# Patient Record
Sex: Male | Born: 1960 | Race: White | Hispanic: No | Marital: Married | Smoking: Former smoker
Health system: Southern US, Community
[De-identification: ages and names within clinical notes are randomized; demographics above are authoritative.]

## PROBLEM LIST (undated history)

## (undated) ENCOUNTER — Emergency Department: Admission: EM | Discharge status: Absent without leave | Payer: Self-pay

## (undated) DIAGNOSIS — R9082 White matter disease, unspecified: Secondary | ICD-10-CM

## (undated) DIAGNOSIS — N200 Calculus of kidney: Secondary | ICD-10-CM

## (undated) DIAGNOSIS — R51 Headache: Secondary | ICD-10-CM

## (undated) DIAGNOSIS — N289 Disorder of kidney and ureter, unspecified: Secondary | ICD-10-CM

## (undated) DIAGNOSIS — Z87891 Personal history of nicotine dependence: Secondary | ICD-10-CM

## (undated) DIAGNOSIS — N400 Enlarged prostate without lower urinary tract symptoms: Secondary | ICD-10-CM

## (undated) DIAGNOSIS — G44099 Other trigeminal autonomic cephalgias (TAC), not intractable: Secondary | ICD-10-CM

## (undated) DIAGNOSIS — R519 Headache, unspecified: Secondary | ICD-10-CM

## (undated) HISTORY — PX: BICEPT TENODESIS: SHX5116

## (undated) HISTORY — DX: Calculus of kidney: N20.0

## (undated) HISTORY — DX: Headache: R51

## (undated) HISTORY — PX: ROTATOR CUFF REPAIR: SHX139

## (undated) HISTORY — DX: Headache, unspecified: R51.9

## (undated) HISTORY — DX: Benign prostatic hyperplasia without lower urinary tract symptoms: N40.0

## (undated) HISTORY — DX: White matter disease, unspecified: R90.82

## (undated) HISTORY — DX: Personal history of nicotine dependence: Z87.891

## (undated) HISTORY — PX: COLONOSCOPY: SHX174

## (undated) HISTORY — PX: LUMBAR EPIDURAL INJECTION: SHX1980

## (undated) HISTORY — DX: Other trigeminal autonomic cephalgias (tac), not intractable: G44.099

## (undated) HISTORY — PX: HERNIA REPAIR: SHX51

## (undated) HISTORY — PX: HAND SURGERY: SHX662

## (undated) HISTORY — PX: LITHOTRIPSY: SUR834

---

## 2011-12-28 ENCOUNTER — Emergency Department (INDEPENDENT_AMBULATORY_CARE_PROVIDER_SITE_OTHER)
Admission: EM | Admit: 2011-12-28 | Discharge: 2011-12-28 | Disposition: A | Discharge status: Home / Self Care | Payer: Managed Care, Other (non HMO) | Source: Home / Self Care | Attending: Family Medicine | Admitting: Family Medicine

## 2011-12-28 DIAGNOSIS — R319 Hematuria, unspecified: Secondary | ICD-10-CM

## 2011-12-28 LAB — POCT CBC W AUTO DIFF (K'VILLE URGENT CARE)

## 2011-12-28 LAB — COMPREHENSIVE METABOLIC PANEL
Alkaline Phosphatase: 58 U/L (ref 39–117)
Glucose, Bld: 93 mg/dL (ref 70–99)
Sodium: 144 mEq/L (ref 135–145)
Total Bilirubin: 0.5 mg/dL (ref 0.3–1.2)
Total Protein: 6.8 g/dL (ref 6.0–8.3)

## 2011-12-28 LAB — POCT URINALYSIS DIP (MANUAL ENTRY): Leukocytes, UA: NEGATIVE

## 2011-12-28 MED ORDER — CIPROFLOXACIN HCL 500 MG PO TABS: 500.0000 mg | ORAL_TABLET | Freq: Two times a day (BID) | ORAL | Status: DC

## 2011-12-28 NOTE — ED Provider Notes (Signed)
History     CSN: [id number]  Arrival date & time 12/28/11  1455   First MD Initiated Contact with Patient 12/28/11 1533      Chief Complaint  Patient presents with  . Dysuria      HPI Comments: Two days ago patient developed rather sudden left abdominal and flank pain, associated with nausea.  He also developed a migraine headache, fatigue, and dizziness.  Yesterday he noticed very dark urine, and today he had sweats but states that his pain has resolved. He has a past history of kidney stones, having passed them spontaneously.  Patient is a 51 y.o. male presenting with flank pain. The history is provided by the patient and the spouse.  Flank Pain This is a new problem. The current episode started 2 days ago. The problem occurs constantly. The problem has been resolved. Associated symptoms include abdominal pain and headaches. Pertinent negatives include no shortness of breath. Nothing aggravates the symptoms. Nothing relieves the symptoms. He has tried nothing for the symptoms.    History reviewed. No pertinent past medical history.  Past Surgical History  Procedure Date  . Hand surgery   . Rotator cuff repair   . Bicept tenodesis     Family History  Problem Relation Age of Onset  . Hypertension Mother   . Hyperlipidemia Mother   . Hypertension Father   . Hyperlipidemia Father   . Stroke Father   . Diabetes Father   . Hypertension Sister   . Hyperlipidemia Sister   . Hypertension Brother   . Hyperlipidemia Brother     History  Substance Use Topics  . Smoking status: Former Games developer  . Smokeless tobacco: Not on file  . Alcohol Use: No      Review of Systems  Respiratory: Negative for shortness of breath.   Gastrointestinal: Positive for abdominal pain.  Genitourinary: Positive for flank pain.  Neurological: Positive for headaches.  All other systems reviewed and are negative.    Allergies  Review of patient's allergies indicates no known  allergies.  Home Medications   Current Outpatient Rx  Name  Route  Sig  Dispense  Refill  . CIPROFLOXACIN HCL 500 MG PO TABS   Oral   Take 1 tablet (500 mg total) by mouth 2 (two) times daily.   14 tablet   0     BP 104/68  Pulse 71  Temp 97.8 F (36.6 C) (Oral)  Resp 18  Ht 6' (1.829 m)  Wt 168 lb 9 oz (76.459 kg)  BMI 22.86 kg/m2  SpO2 97%  Physical Exam Nursing notes and Vital Signs reviewed. Appearance:  Patient appears healthy, stated age, and in no acute distress Eyes:  Pupils are equal, round, and reactive to light and accomodation.  Extraocular movement is intact.  Conjunctivae are not inflamed  Ears:  Canals normal.  Tympanic membranes normal.  Nose:   Normal turbinates.  No sinus tenderness.   Pharynx:  Normal; moist mucous membranes  Neck:  Supple.   No adenopathy Lungs:  Clear to auscultation.  Breath sounds are equal.  Heart:  Regular rate and rhythm without murmurs, rubs, or gallops.  Abdomen:  Nontender without masses or hepatosplenomegaly.  Bowel sounds are present.  No CVA or flank tenderness.  Extremities:  No edema.  No calf tenderness Skin:  No rash present.   ED Course  Procedures  none  Labs Reviewed  COMPREHENSIVE METABOLIC PANEL - Abnormal; Notable for the following:    Potassium 5.7 (*)  No visible hemolysis.   All other components within normal limits   Narrative:    Performed at:  [facility]                [address], [address]                [place], [place] [address]   POCT URINALYSIS DIP (MANUAL ENTRY) small bili; trace ket; large blood, SG >= 1.030   URINE CULTURE   Narrative:    Performed at:  [facility]                [address]                [place], [place] [address]  POCT CBC:  WBC 8.1; LY 20.4; MO 5.1; GR 74.5; Hgb 15.7; Platelets 242     1. Hematuria   Suspect that patient has already passed renal stone   MDM  Urine culture and CMP pending. Begin empiric  Cipro. Increase fluid intake Followup with Family Doctor in 3 days.  Red flags discussed.        Lattie Haw, MD 12/30/11 1315

## 2011-12-28 NOTE — ED Notes (Signed)
Pt states on Friday and start with LLQ pain and left flank pain also noted with very dark brown urine.  History of kidney stones, bladder and prostate infections.

## 2011-12-30 ENCOUNTER — Telehealth: Payer: Self-pay | Admitting: *Deleted

## 2011-12-30 LAB — URINE CULTURE

## 2011-12-30 NOTE — ED Notes (Unsigned)
pt called requesting lab results. advised the pt of CMP results. per Dr Alvester Morin increase water, stop OTC K+, and have CMP repeated tomorrow. If he develops chest pain or SOB proceed to ED. pt states that he will have lab repeated next wk because he will be going out of town tomorrow morning. UC pending, will call pt when results are available.

## 2011-12-31 ENCOUNTER — Telehealth: Payer: Self-pay | Admitting: *Deleted

## 2012-06-28 ENCOUNTER — Encounter: Payer: Self-pay | Admitting: *Deleted

## 2012-06-28 ENCOUNTER — Telehealth: Payer: Self-pay | Admitting: *Deleted

## 2012-06-28 ENCOUNTER — Emergency Department (INDEPENDENT_AMBULATORY_CARE_PROVIDER_SITE_OTHER)
Admission: EM | Admit: 2012-06-28 | Discharge: 2012-06-28 | Disposition: A | Discharge status: Home / Self Care | Payer: Managed Care, Other (non HMO) | Source: Home / Self Care | Attending: Family Medicine | Admitting: Family Medicine

## 2012-06-28 ENCOUNTER — Ambulatory Visit
Admission: RE | Admit: 2012-06-28 | Discharge: 2012-06-28 | Disposition: A | Discharge status: Home / Self Care | Payer: Managed Care, Other (non HMO) | Source: Ambulatory Visit | Attending: Family Medicine | Admitting: Family Medicine

## 2012-06-28 DIAGNOSIS — R319 Hematuria, unspecified: Secondary | ICD-10-CM

## 2012-06-28 LAB — POCT URINALYSIS DIP (MANUAL ENTRY)
Bilirubin, UA: NEGATIVE
Leukocytes, UA: NEGATIVE
Nitrite, UA: NEGATIVE
Protein Ur, POC: 30
Urobilinogen, UA: 0.2 (ref 0–1)
pH, UA: 6.5 (ref 5–8)

## 2012-06-28 MED ORDER — IOHEXOL 300 MG/ML  SOLN
125.0000 mL | Freq: Once | INTRAMUSCULAR | Status: AC | PRN
  Administered 2012-06-28: 125 mL via INTRAVENOUS

## 2012-06-28 MED ORDER — CIPROFLOXACIN HCL 500 MG PO TABS: 500.0000 mg | ORAL_TABLET | Freq: Two times a day (BID) | ORAL | Status: DC

## 2012-06-28 NOTE — ED Notes (Signed)
Hondo c/o gross hematuria x this am. He also reports testicular pain intermittent.

## 2012-06-28 NOTE — ED Provider Notes (Addendum)
History     CSN: [id number]  Arrival date & time 06/28/12  1147   First MD Initiated Contact with Patient 06/28/12 1207      Chief Complaint  Patient presents with  . Hematuria   HPI  Gross hematuria x 1 day  Has had 2-3 episodes of gross bloody urine.  No dysuria, though with some back pain and mild polyuria.  Has had prior episodes of severe prostatitis.  Has had his care in [place] in the past.  Was hospitalized in [place] for similar sxs about 2 years ago.  Believes he had a normal workup but is unsure.  Was dxd with UTI about 6 months ago.  No unintended weight loss.  No fevers, chills.  Former smoker, quit 20-30 years ago.  No penile discharge.  No prior hx/o STDs.    History reviewed. No pertinent past medical history.  Past Surgical History  Procedure Laterality Date  . Hand surgery    . Rotator cuff repair    . Bicept tenodesis      Family History  Problem Relation Age of Onset  . Hypertension Mother   . Hyperlipidemia Mother   . Hypertension Father   . Hyperlipidemia Father   . Stroke Father   . Diabetes Father   . Hypertension Sister   . Hyperlipidemia Sister   . Hypertension Brother   . Hyperlipidemia Brother     History  Substance Use Topics  . Smoking status: Former Games developer  . Smokeless tobacco: Never Used  . Alcohol Use: No      Review of Systems  All other systems reviewed and are negative.    Allergies  Review of patient's allergies indicates no known allergies.  Home Medications   Current Outpatient Rx  Name  Route  Sig  Dispense  Refill  . Folic Acid-Vit B6-Vit B12 (FOLBEE) 2.5-25-1 MG TABS   Oral   Take 1 tablet by mouth daily.         . ciprofloxacin (CIPRO) 500 MG tablet   Oral   Take 1 tablet (500 mg total) by mouth 2 (two) times daily.   28 tablet   0     BP 126/77  Pulse 61  Temp(Src) 97.8 F (36.6 C) (Oral)  Resp 14  Ht 6' (1.829 m)  Wt 170 lb (77.111 kg)  BMI 23.05 kg/m2  SpO2 98%  Physical  Exam  Constitutional: He appears well-developed and well-nourished.  HENT:  Head: Normocephalic and atraumatic.  Eyes: Conjunctivae are normal. Pupils are equal, round, and reactive to light.  Neck: Normal range of motion. Neck supple.  Cardiovascular: Normal rate, regular rhythm and normal heart sounds.   Pulmonary/Chest: Effort normal.  Abdominal: Soft. Bowel sounds are normal.  No flank pain/CVA tenderness Mild lumbar back pain    Genitourinary: Prostate normal.  Musculoskeletal: Normal range of motion.  Neurological: He is alert.  Skin: Skin is warm.    ED Course  Procedures (including critical care time)  Labs Reviewed - No data to display No results found.   1. Hematuria       MDM  Marked hematuria on UA  WIll send for CT Abd and Pelvis w/ and w/o IV contrast to better assess GU anatomy.Case discussed with CT radiologist.  No prostatic tenderness.  Will place on 2 weeks course of cipro pending urine culture and urology follow up.  Discussed GU red flags at length.  Follow up as needed.     The patient and/or caregiver  has been counseled thoroughly with regard to treatment plan and/or medications prescribed including dosage, schedule, interactions, rationale for use, and possible side effects and they verbalize understanding. Diagnoses and expected course of recovery discussed and will return if not improved as expected or if the condition worsens. Patient and/or caregiver verbalized understanding.             Doree Albee, MD 06/28/12 1234  Doree Albee, MD 06/28/12 1242

## 2012-06-29 ENCOUNTER — Telehealth: Payer: Self-pay | Admitting: *Deleted

## 2012-06-29 LAB — CBC
HCT: 44.6 % (ref 39.0–52.0)
MCH: 29.9 pg (ref 26.0–34.0)
MCHC: 34.1 g/dL (ref 30.0–36.0)
MCV: 87.8 fL (ref 78.0–100.0)
Platelets: 244 10*3/uL (ref 150–400)
RDW: 13.9 % (ref 11.5–15.5)

## 2012-06-29 LAB — COMPREHENSIVE METABOLIC PANEL
ALT: 26 U/L (ref 0–53)
AST: 29 U/L (ref 0–37)
BUN: 19 mg/dL (ref 6–23)
Creat: 0.95 mg/dL (ref 0.50–1.35)
Total Bilirubin: 0.5 mg/dL (ref 0.3–1.2)

## 2012-07-03 ENCOUNTER — Telehealth: Payer: Self-pay

## 2012-07-03 NOTE — ED Notes (Signed)
Vincent Ramirez did follow up with urologist. He was advised to go to the ED by the urologist for pain management.

## 2013-05-20 DIAGNOSIS — Z9889 Other specified postprocedural states: Secondary | ICD-10-CM

## 2013-05-20 DIAGNOSIS — Z8719 Personal history of other diseases of the digestive system: Secondary | ICD-10-CM | POA: Insufficient documentation

## 2013-11-08 IMAGING — CT CT ABD-PEL WO/W CM
3 of 10 series · 13 of 46 positions shown, 19 images · IV contrast (WATER & [ID] OMNI 300)
Comparison: None.

CLINICAL DATA: Left flank pain and hematuria.

CT ABDOMEN AND PELVIS WITHOUT AND WITH CONTRAST
TECHNIQUE: Multidetector CT imaging of the abdomen and pelvis was
performed without contrast material in one or both body regions,
followed by contrast material(s) and further sections in one or
both body regions.
Contrast: 125mL OMNIPAQUE IOHEXOL 300 MG/ML  SOLN

[Series 4: hematuria with >45 · axial · 0.68mm/px · z∈[-339,+21]mm · 8 of 94 slices shown, 13 images]
[im 11/94  soft-tissue]
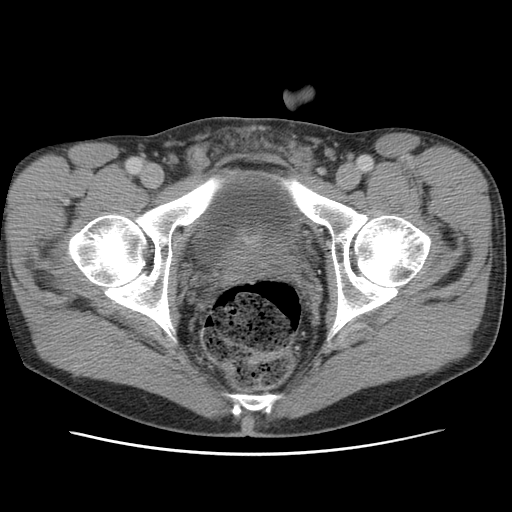
[im 11/94  bone]
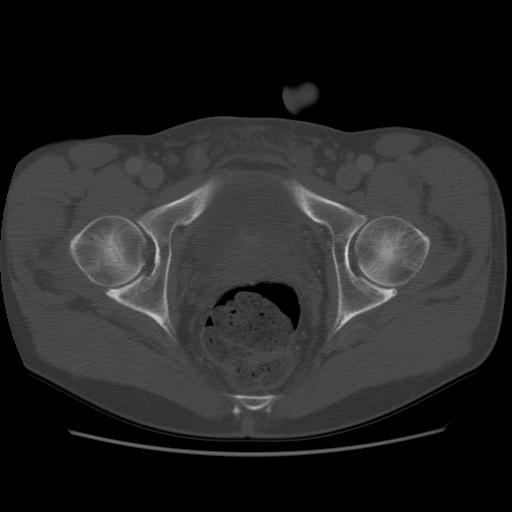
[im 21/94  soft-tissue]
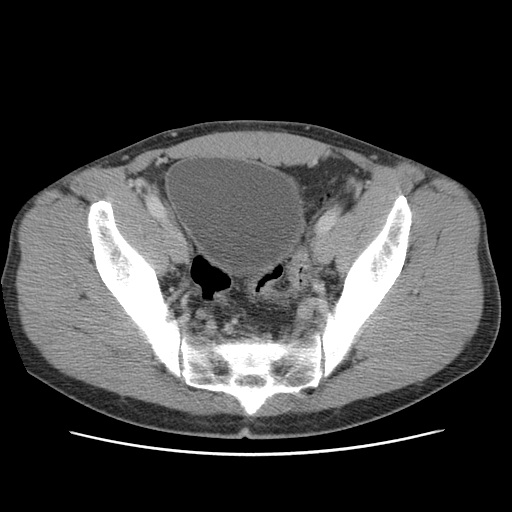
[im 32/94  soft-tissue]
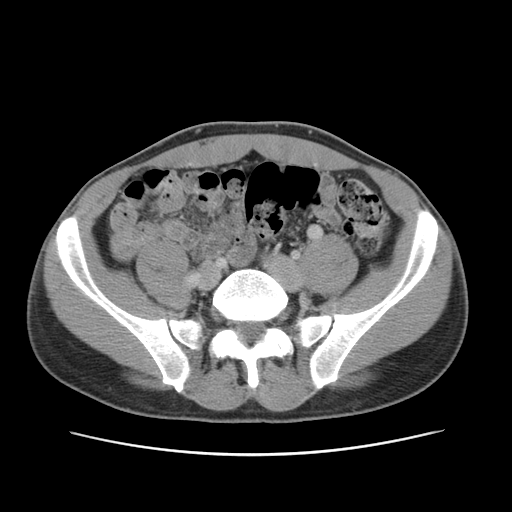
[im 42/94  soft-tissue]
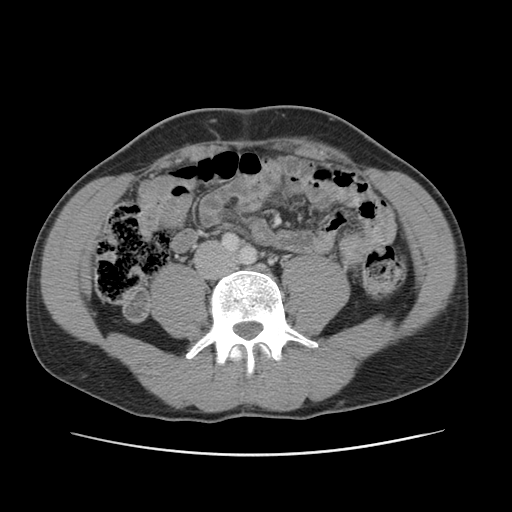
[im 52/94  soft-tissue]
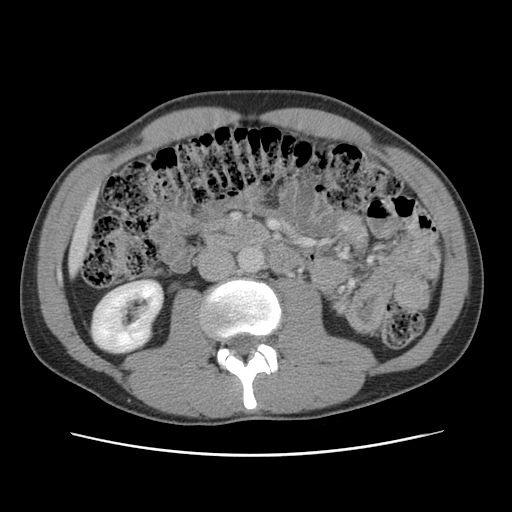
[im 52/94  lung]
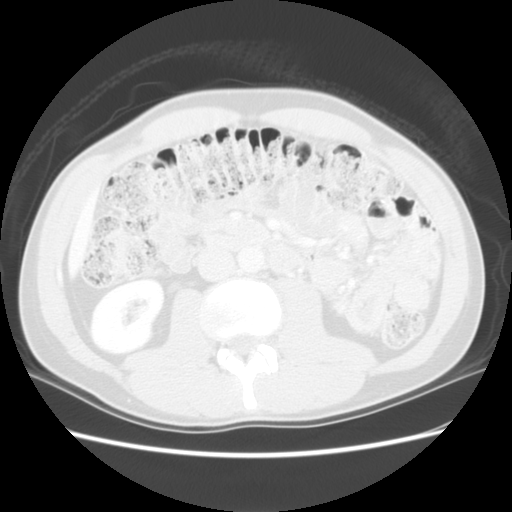
[im 63/94  soft-tissue]
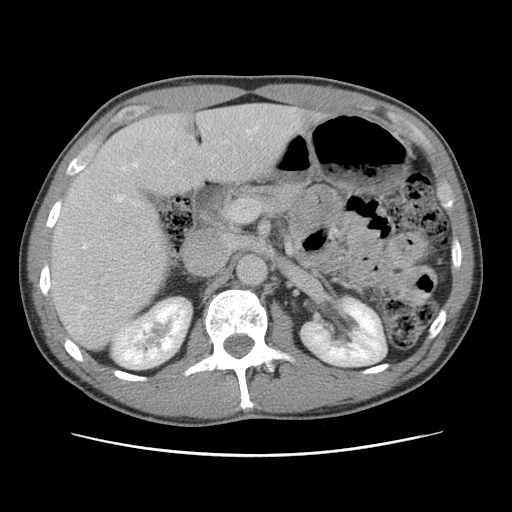
[im 63/94  lung]
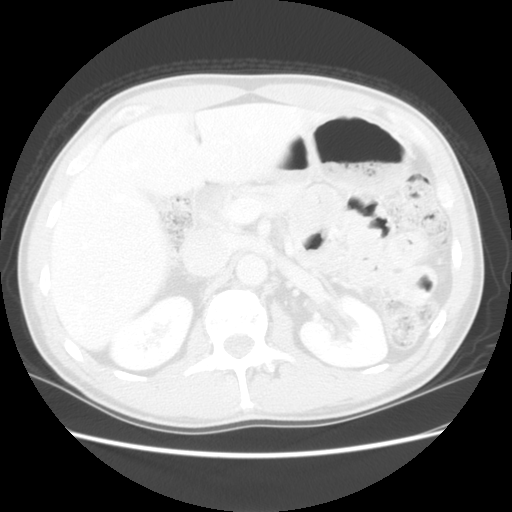
[im 73/94  soft-tissue]
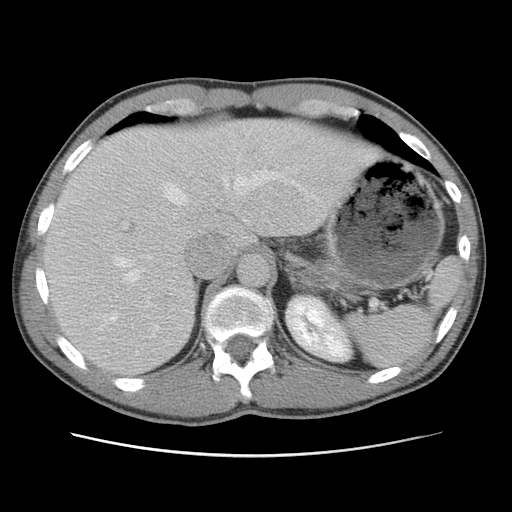
[im 73/94  lung]
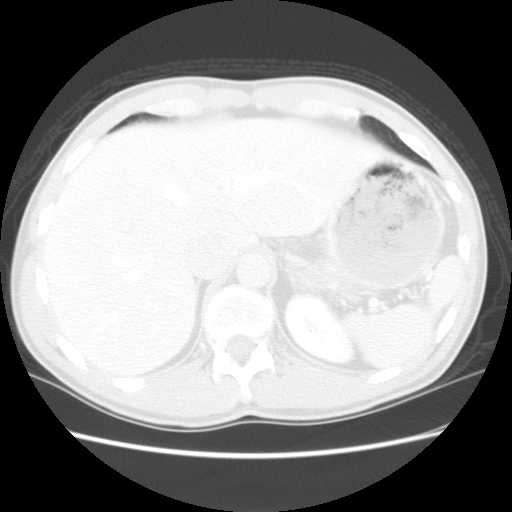
[im 83/94  soft-tissue]
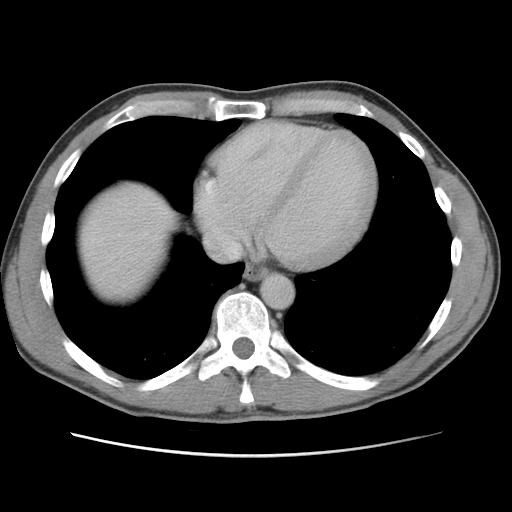
[im 83/94  lung]
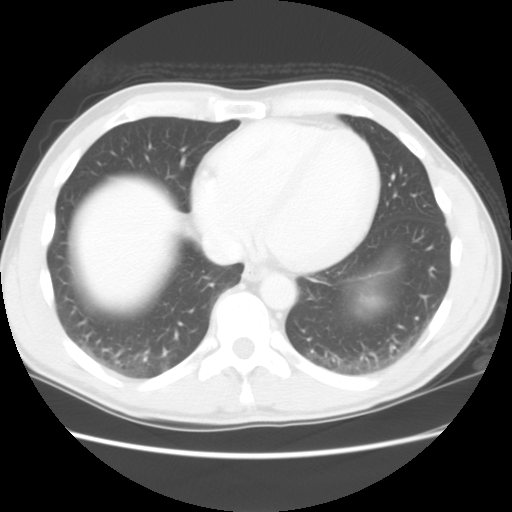

[Series 8: hematuria prone 10 min delay · axial · delayed · 0.70mm/px · z∈[-424,-308]mm · 3 of 98 slices shown]
[im 11/98  soft-tissue]
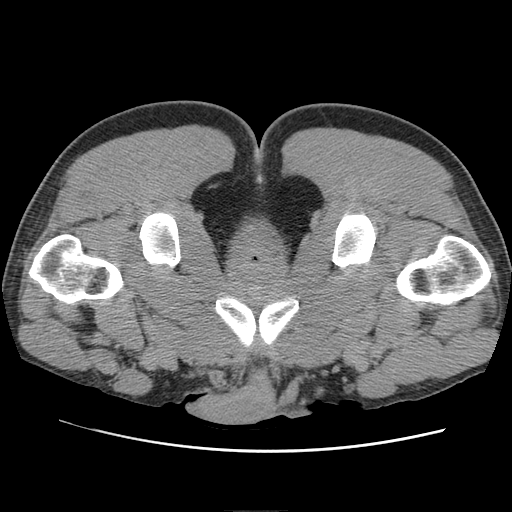
[im 22/98  soft-tissue]
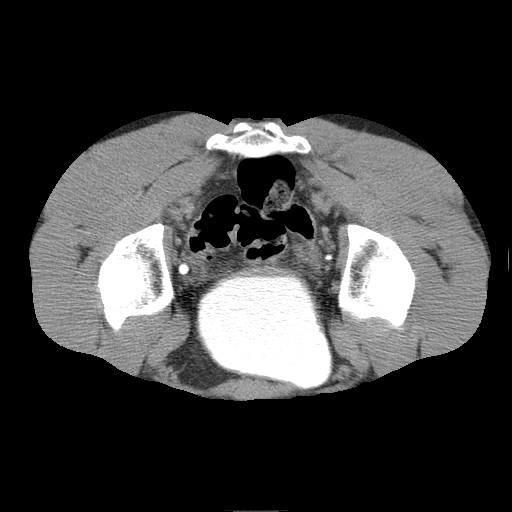
[im 33/98  soft-tissue]
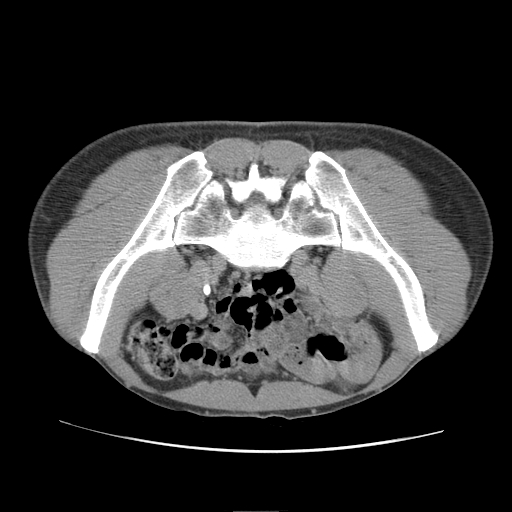

[Series 300: cor w/o · coronal · non-contrast · 0.95mm/px · 2 of 131 slices shown, 3 images]
[im 44/131  soft-tissue]
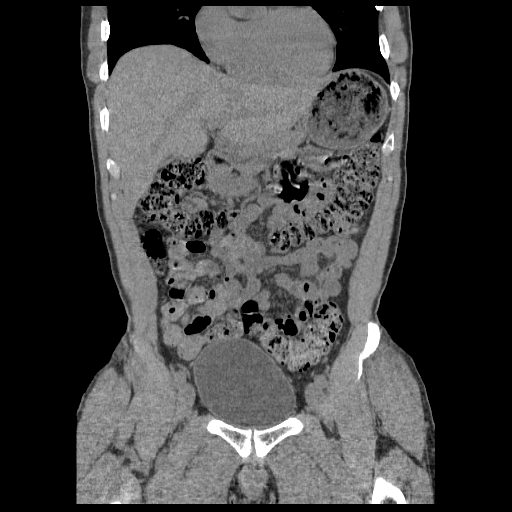
[im 44/131  bone]
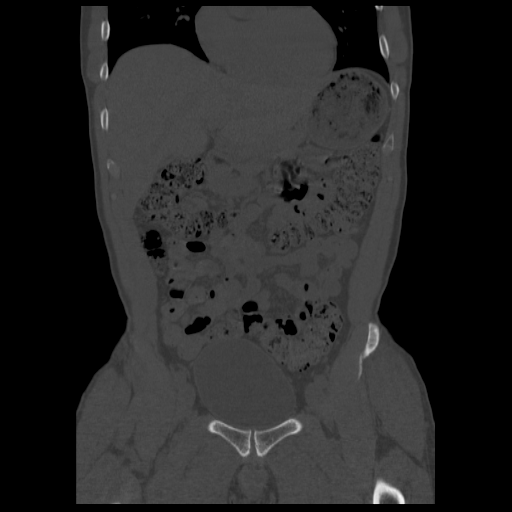
[im 87/131  soft-tissue]
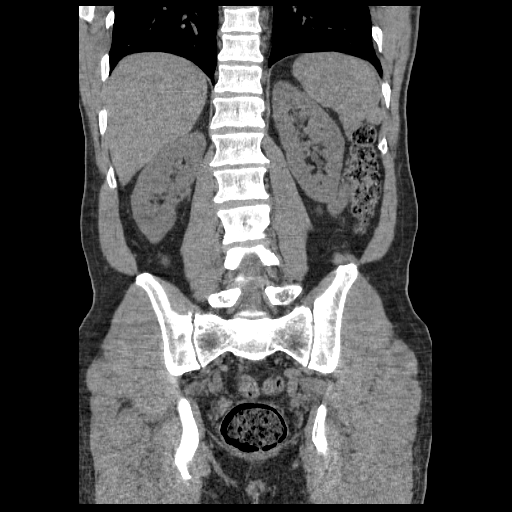

[13 of 46 positions shown; findings below may reference images not displayed]

FINDINGS: The lung bases are clear.  Minimal dependent atelectasis.

There are small bilateral renal calculi.  There is mild left-sided
hydronephrosis and left hydroureter down to an obstructing 4 mm
calculus in the distal ureter just proximal to the ureteral vesicle
junction.  No right-sided ureteral calculi and no bladder calculi.
The prostate gland and seminal vesicles are unremarkable.  After
contrast administration both kidneys demonstrate normal
perfusion/enhancement. There is a small low attenuation lesion in
the lower pole region of the right kidney which is indeterminate.
I think this is most likely a benign cyst with a pseudo
enhancement.  The delayed images demonstrate contrast in both
ureters all way down to the pelvis.

The liver is unremarkable.  No worrisome liver lesions or biliary
dilatation.  The gallbladder is contracted.  No common bile duct
dilatation.  The pancreas, spleen and adrenal glands are
unremarkable.

The stomach, duodenum, small bowel and colon are grossly normal
without oral contrast.  There is a moderate amount of stool
throughout the colon and down into the rectum which may suggest
constipation.  No mesenteric or retroperitoneal mass or adenopathy.
The aorta is normal in caliber.
IMPRESSION: 1.  4 mm distal left ureteral calculus causing a low grade
obstruction.
2.  Bilateral renal calculi.
3.  10 mm low attenuation lower pole right renal lesion is most
likely a benign cyst.

## 2014-02-05 ENCOUNTER — Emergency Department (INDEPENDENT_AMBULATORY_CARE_PROVIDER_SITE_OTHER)
Admission: EM | Admit: 2014-02-05 | Discharge: 2014-02-05 | Disposition: A | Discharge status: Home / Self Care | Payer: BLUE CROSS/BLUE SHIELD | Source: Home / Self Care | Attending: Family Medicine | Admitting: Family Medicine

## 2014-02-05 ENCOUNTER — Emergency Department (INDEPENDENT_AMBULATORY_CARE_PROVIDER_SITE_OTHER): Payer: BLUE CROSS/BLUE SHIELD

## 2014-02-05 ENCOUNTER — Encounter: Payer: Self-pay | Admitting: Emergency Medicine

## 2014-02-05 DIAGNOSIS — R05 Cough: Secondary | ICD-10-CM

## 2014-02-05 DIAGNOSIS — R059 Cough, unspecified: Secondary | ICD-10-CM

## 2014-02-05 DIAGNOSIS — J209 Acute bronchitis, unspecified: Secondary | ICD-10-CM

## 2014-02-05 HISTORY — DX: Disorder of kidney and ureter, unspecified: N28.9

## 2014-02-05 MED ORDER — PREDNISONE 10 MG PO TABS: 30.0000 mg | ORAL_TABLET | Freq: Every day | ORAL | Status: DC

## 2014-02-05 MED ORDER — IPRATROPIUM BROMIDE 0.06 % NA SOLN: 2.0000 | Freq: Four times a day (QID) | NASAL | Status: DC

## 2014-02-05 NOTE — ED Provider Notes (Signed)
Vincent ShanRandolph Ramirez is a 54 y.o. male who presents to Urgent Care today for cough congestion nasal discharge facial pain and pressure headache and body aches. Symptoms present for 3 days. Patient noticed some mild shortness of breath last night. He is a productive cough which is improving a bit with Mucinex. His symptoms are somewhat consistent with prior episodes of pneumonia. No vomiting or diarrhea. He's had pneumonia 3 times.   Past Medical History  Diagnosis Date  . Renal disorder     calculi   Past Surgical History  Procedure Laterality Date  . Hand surgery    . Rotator cuff repair    . Bicept tenodesis     History  Substance Use Topics  . Smoking status: Former Games developermoker  . Smokeless tobacco: Never Used  . Alcohol Use: No   ROS as above Medications: No current facility-administered medications for this encounter.   Current Outpatient Prescriptions  Medication Sig Dispense Refill  . Folic Acid-Vit B6-Vit B12 (FOLBEE) 2.5-25-1 MG TABS Take 1 tablet by mouth daily.    Marland Kitchen. ipratropium (ATROVENT) 0.06 % nasal spray Place 2 sprays into both nostrils 4 (four) times daily. 15 mL 1  . predniSONE (DELTASONE) 10 MG tablet Take 3 tablets (30 mg total) by mouth daily. 15 tablet 0   No Known Allergies   Exam:  BP 109/64 mmHg  Pulse 74  Temp(Src) 98.5 F (36.9 C) (Oral)  Resp 18  Ht 6' (1.829 m)  Wt 170 lb (77.111 kg)  BMI 23.05 kg/m2  SpO2 97% Gen: Well NAD HEENT: EOMI,  MMM clear nasal discharge. Normal posterior pharynx. Mildly inflamed nasal turbinates. Normal tympanic membranes bilaterally. Mildly tender palpation bilateral frontal sinuses. Lungs: Normal work of breathing. CTABL Heart: RRR no MRG Abd: NABS, Soft. Nondistended, Nontender Exts: Brisk capillary refill, warm and well perfused.   No results found for this or any previous visit (from the past 24 hour(s)). Dg Chest 2 View  02/05/2014   CLINICAL DATA:  Two day history of cough  EXAM: CHEST  2 VIEW  COMPARISON:  None.   FINDINGS: Lungs are clear. Heart size and pulmonary vascularity are normal. No adenopathy. No bone lesions.  IMPRESSION: No edema or consolidation.   Electronically Signed   By: Bretta BangWilliam  Woodruff M.D.   On: 02/05/2014 13:52    Assessment and Plan: 54 y.o. male with  Bronchitis treatment with prednisone and Atrovent nasal spray  Discussed warning signs or symptoms. Please see discharge instructions. Patient expresses understanding.     Rodolph BongEvan S Noha Karasik, MD 02/05/14 [phone]

## 2014-02-05 NOTE — ED Notes (Signed)
Reports 3rd day of congestion, cough, aches, hoarseness, sore throat and fatigue. States has had pneumonia x3 and worries that this is happening. Did have Flu vaccination this season.

## 2014-02-05 NOTE — Discharge Instructions (Signed)
Thank you for coming in today.  I'm sorry the x-ray is taking so long.  Take prednisone and  Atrovent nasal spray  I will call if the x-ray comes back abnormal Call or go to the emergency room if you get worse, have trouble breathing, have chest pains, or palpitations.    Cough, Adult  A cough is a reflex that helps clear your throat and airways. It can help heal the body or may be a reaction to an irritated airway. A cough may only last 2 or 3 weeks (acute) or may last more than 8 weeks (chronic).  CAUSES Acute cough:  Viral or bacterial infections. Chronic cough:  Infections.  Allergies.  Asthma.  Post-nasal drip.  Smoking.  Heartburn or acid reflux.  Some medicines.  Chronic lung problems (COPD).  Cancer. SYMPTOMS   Cough.  Fever.  Chest pain.  Increased breathing rate.  High-pitched whistling sound when breathing (wheezing).  Colored mucus that you cough up (sputum). TREATMENT   A bacterial cough may be treated with antibiotic medicine.  A viral cough must run its course and will not respond to antibiotics.  Your caregiver may recommend other treatments if you have a chronic cough. HOME CARE INSTRUCTIONS   Only take over-the-counter or prescription medicines for pain, discomfort, or fever as directed by your caregiver. Use cough suppressants only as directed by your caregiver.  Use a cold steam vaporizer or humidifier in your bedroom or home to help loosen secretions.  Sleep in a semi-upright position if your cough is worse at night.  Rest as needed.  Stop smoking if you smoke. SEEK IMMEDIATE MEDICAL CARE IF:   You have pus in your sputum.  Your cough starts to worsen.  You cannot control your cough with suppressants and are losing sleep.  You begin coughing up blood.  You have difficulty breathing.  You develop pain which is getting worse or is uncontrolled with medicine.  You have a fever. MAKE SURE YOU:   Understand these  instructions.  Will watch your condition.  Will get help right away if you are not doing well or get worse. Document Released: 07/12/2010 Document Revised: 04/07/2011 Document Reviewed: 07/12/2010 [facility] Patient Information 2015 [place], [place]. This information is not intended to replace advice given to you by your health care provider. Make sure you discuss any questions you have with your health care provider.

## 2014-02-11 ENCOUNTER — Telehealth: Payer: Self-pay | Admitting: Emergency Medicine

## 2014-02-12 ENCOUNTER — Telehealth: Payer: Self-pay | Admitting: Emergency Medicine

## 2014-04-03 DIAGNOSIS — R202 Paresthesia of skin: Secondary | ICD-10-CM | POA: Insufficient documentation

## 2014-04-03 DIAGNOSIS — G4700 Insomnia, unspecified: Secondary | ICD-10-CM | POA: Insufficient documentation

## 2014-04-03 DIAGNOSIS — Z8669 Personal history of other diseases of the nervous system and sense organs: Secondary | ICD-10-CM | POA: Insufficient documentation

## 2014-05-02 DIAGNOSIS — R9082 White matter disease, unspecified: Secondary | ICD-10-CM | POA: Insufficient documentation

## 2015-01-20 ENCOUNTER — Emergency Department (INDEPENDENT_AMBULATORY_CARE_PROVIDER_SITE_OTHER)
Admission: EM | Admit: 2015-01-20 | Discharge: 2015-01-20 | Disposition: A | Discharge status: Home / Self Care | Payer: BLUE CROSS/BLUE SHIELD | Source: Home / Self Care | Attending: Family Medicine | Admitting: Family Medicine

## 2015-01-20 DIAGNOSIS — B9789 Other viral agents as the cause of diseases classified elsewhere: Principal | ICD-10-CM

## 2015-01-20 DIAGNOSIS — J069 Acute upper respiratory infection, unspecified: Secondary | ICD-10-CM

## 2015-01-20 DIAGNOSIS — G43009 Migraine without aura, not intractable, without status migrainosus: Secondary | ICD-10-CM | POA: Diagnosis not present

## 2015-01-20 DIAGNOSIS — R110 Nausea: Secondary | ICD-10-CM | POA: Diagnosis not present

## 2015-01-20 LAB — POCT RAPID STREP A (OFFICE): RAPID STREP A SCREEN: NEGATIVE

## 2015-01-20 MED ORDER — DEXAMETHASONE SODIUM PHOSPHATE 10 MG/ML IJ SOLN
10.0000 mg | Freq: Once | INTRAMUSCULAR | Status: AC
  Administered 2015-01-20: 10 mg via INTRAMUSCULAR

## 2015-01-20 MED ORDER — ONDANSETRON 4 MG PO TBDP: ORAL_TABLET | ORAL | Status: DC

## 2015-01-20 MED ORDER — BENZONATATE 200 MG PO CAPS: 200.0000 mg | ORAL_CAPSULE | Freq: Every day | ORAL | Status: DC

## 2015-01-20 MED ORDER — DEXAMETHASONE SODIUM PHOSPHATE 10 MG/ML IJ SOLN: 10.0000 mg | Freq: Once | INTRAMUSCULAR | Status: DC

## 2015-01-20 MED ORDER — KETOROLAC TROMETHAMINE 60 MG/2ML IM SOLN
60.0000 mg | Freq: Once | INTRAMUSCULAR | Status: AC
  Administered 2015-01-20: 60 mg via INTRAMUSCULAR

## 2015-01-20 MED ORDER — AMOXICILLIN 875 MG PO TABS: 875.0000 mg | ORAL_TABLET | Freq: Two times a day (BID) | ORAL | Status: DC

## 2015-01-20 MED ORDER — METOCLOPRAMIDE HCL 5 MG/ML IJ SOLN
5.0000 mg | Freq: Once | INTRAMUSCULAR | Status: AC
  Administered 2015-01-20: 5 mg via INTRAMUSCULAR

## 2015-01-20 MED ORDER — OXYCODONE-ACETAMINOPHEN 5-325 MG PO TABS: 1.0000 | ORAL_TABLET | ORAL | Status: DC | PRN

## 2015-01-20 MED ORDER — PROMETHAZINE HCL 25 MG PO TABS: ORAL_TABLET | ORAL | Status: DC

## 2015-01-20 NOTE — ED Notes (Signed)
Started with sore throat Wednesday, coughing, congestion, drainage down throat.  Chest tight and burning when breathing.  Saw company MD Wednesday, she said chest was clear.  Has had a headache that turned into a migraine about 24 hours ago.  Pain across eyebrows and left sinus, nauseated, but denies vomiting.

## 2015-01-20 NOTE — Discharge Instructions (Signed)
Take plain guaifenesin (1200mg  extended release tabs such as Mucinex) twice daily, with plenty of water, for cough and congestion.  Get adequate rest.   May use Afrin nasal spray (or generic oxymetazoline) twice daily for about 5 days and then discontinue.  Also recommend using saline nasal spray several times daily and saline nasal irrigation (AYR is a common brand).   Try warm salt water gargles for sore throat.  Stop all antihistamines for now, and other non-prescription cough/cold preparations.   Follow-up with family doctor if not improving about 5 to 7 days.

## 2015-01-20 NOTE — ED Provider Notes (Signed)
CSN: [id number]     Arrival date & time 01/20/15  1013 History   First MD Initiated Contact with Patient 01/20/15 1055     Chief Complaint  Patient presents with  . Headache  . Cough      HPI Comments: Patient complains of four day history of typical cold-like symptoms including mild sore throat, sinus congestion, chills, fatigue, and cough.  He has a history of migraine headaches, developing a typical headache two days ago.  However, his headache has persisted, not responding to his usual treatments.  He has had nausea without vomiting.  No localizing neurologic symptoms.  He has developed persistent bilateral facial pressure and discomfort.  He has a past history of pneumonia about five years ago.  The history is provided by the patient and the spouse.    Past Medical History  Diagnosis Date  . Renal disorder     calculi   Past Surgical History  Procedure Laterality Date  . Hand surgery    . Rotator cuff repair    . Bicept tenodesis    . Hernia repair     Family History  Problem Relation Age of Onset  . Hypertension Mother   . Hyperlipidemia Mother   . Hypertension Father   . Hyperlipidemia Father   . Stroke Father   . Diabetes Father   . Hypertension Sister   . Hyperlipidemia Sister   . Hypertension Brother   . Hyperlipidemia Brother    Social History  Substance Use Topics  . Smoking status: Former Games developer  . Smokeless tobacco: Never Used  . Alcohol Use: No    Review of Systems + sore throat + cough No pleuritic pain No wheezing + nasal congestion + post-nasal drainage + sinus pain/pressure No itchy/red eyes No earache No hemoptysis No SOB No fever, + chills + nausea No vomiting No abdominal pain No diarrhea No urinary symptoms No skin rash + fatigue + myalgias + headache Used OTC meds without relief  Allergies  Review of patient's allergies indicates no known allergies.  Home Medications   Prior to Admission medications   Medication Sig  Start Date End Date Taking? Authorizing Provider  amoxicillin (AMOXIL) 875 MG tablet Take 1 tablet (875 mg total) by mouth 2 (two) times daily. 01/20/15   Lattie Haw, MD  benzonatate (TESSALON) 200 MG capsule Take 1 capsule (200 mg total) by mouth at bedtime. Take as needed for cough 01/20/15   Lattie Haw, MD  Folic Acid-Vit B6-Vit B12 (FOLBEE) 2.5-25-1 MG TABS Take 1 tablet by mouth daily.    Historical Provider, MD  ipratropium (ATROVENT) 0.06 % nasal spray Place 2 sprays into both nostrils 4 (four) times daily. 02/05/14   Rodolph Bong, MD  oxyCODONE-acetaminophen (PERCOCET/ROXICET) 5-325 MG tablet Take 1 tablet by mouth every 4 (four) hours as needed for severe pain. 01/20/15   Lattie Haw, MD  promethazine (PHENERGAN) 25 MG tablet Take one tab by mouth every 4 to 6 hours as needed for nausea 01/20/15   Lattie Haw, MD   Meds Ordered and Administered this Visit   Medications  metoCLOPramide (REGLAN) injection 5 mg (5 mg Intramuscular Given 01/20/15 1131)  ketorolac (TORADOL) injection 60 mg (60 mg Intramuscular Given 01/20/15 1132)  dexamethasone (DECADRON) injection 10 mg (10 mg Intramuscular Given 01/20/15 1134)    BP 136/90 mmHg  Pulse 72  Temp(Src) 97.9 F (36.6 C) (Oral)  Ht 6' (1.829 m)  Wt 166 lb 8 oz (75.524 kg)  BMI 22.58 kg/m2  SpO2 98% No data found.   Physical Exam Nursing notes and Vital Signs reviewed. Appearance:  Patient appears uncomfortable, supine in darkened room but otherwise stated age, and in no acute distress Eyes:  Pupils are equal, round, and reactive to light and accomodation.  Extraocular movement is intact.  Conjunctivae are not inflamed.  Fundi benign; mild photophobia present. Ears:  Canals normal.  Tympanic membranes normal.  Nose:   Congested turbinates.  Maxillary sinus tenderness is present.  Pharynx:  Normal Neck:  Supple.   Tender shotty posterior nodes are palpated bilaterally  Lungs:  Clear to auscultation.  Breath sounds  are equal.  Moving air well. Heart:  Regular rate and rhythm without murmurs, rubs, or gallops.  Abdomen:  Nontender without masses or hepatosplenomegaly.  Bowel sounds are present.  No CVA or flank tenderness.  Extremities:  No edema.  No calf tenderness Skin:  No rash present.   Neurologic:  Cranial nerves 2 through 12 are normal.  Patellar, achilles, and elbow reflexes are normal.  Cerebellar function is intact (finger-to-nose and rapid alternating hand movement).  Gait and station are normal.    ED Course  Procedures none     Labs Reviewed  POCT RAPID STREP A (OFFICE) negative     MDM   1. Viral URI with cough; note bilateral maxillary sinus tenderness   2. Migraine without aura and without status migrainosus, not intractable   3. Nausea without vomiting    Administered Toradol 60mg  IM, Reglan 5mg  IM, and Decadron 10mg  IM Begin empiric amoxicillin 875mg  BID Because Toradol not completely effective in resolving his headache, will write Rx for Percocet 5/325, to be combined with Phenergan 25mg  for persistent headache and nausea. Prescription written for Benzonatate [facility](Tessalon) to take at bedtime for night-time cough.  Take plain guaifenesin (1200mg  extended release tabs such as Mucinex) twice daily, with plenty of water, for cough and congestion.  Get adequate rest.   May use Afrin nasal spray (or generic oxymetazoline) twice daily for about 5 days and then discontinue.  Also recommend using saline nasal spray several times daily and saline nasal irrigation (AYR is a common brand).   Try warm salt water gargles for sore throat.  Stop all antihistamines for now, and other non-prescription cough/cold preparations.   Follow-up with family doctor if not improving about 5 to 7 days.    Lattie HawStephen A Beese, MD 01/24/15 1726

## 2016-11-25 ENCOUNTER — Ambulatory Visit (INDEPENDENT_AMBULATORY_CARE_PROVIDER_SITE_OTHER): Payer: BLUE CROSS/BLUE SHIELD | Admitting: Physician Assistant

## 2016-11-25 ENCOUNTER — Encounter: Payer: Self-pay | Admitting: Physician Assistant

## 2016-11-25 VITALS — BP 115/75 | HR 71 | Temp 97.9°F | Ht 72.0 in | Wt 171.0 lb

## 2016-11-25 DIAGNOSIS — Z87442 Personal history of urinary calculi: Secondary | ICD-10-CM

## 2016-11-25 DIAGNOSIS — Z1211 Encounter for screening for malignant neoplasm of colon: Secondary | ICD-10-CM | POA: Diagnosis not present

## 2016-11-25 DIAGNOSIS — J069 Acute upper respiratory infection, unspecified: Secondary | ICD-10-CM | POA: Diagnosis not present

## 2016-11-25 DIAGNOSIS — G44099 Other trigeminal autonomic cephalgias (TAC), not intractable: Secondary | ICD-10-CM | POA: Diagnosis not present

## 2016-11-25 DIAGNOSIS — M5136 Other intervertebral disc degeneration, lumbar region: Secondary | ICD-10-CM | POA: Insufficient documentation

## 2016-11-25 DIAGNOSIS — Z7689 Persons encountering health services in other specified circumstances: Secondary | ICD-10-CM | POA: Diagnosis not present

## 2016-11-25 DIAGNOSIS — N400 Enlarged prostate without lower urinary tract symptoms: Secondary | ICD-10-CM

## 2016-11-25 HISTORY — DX: Other trigeminal autonomic cephalgias (tac), not intractable: G44.099

## 2016-11-25 MED ORDER — PHENYLEPHRINE-PHENIRAMINE-DM 10-20-20 MG PO PACK: PACK | ORAL | 0 refills | Status: AC

## 2016-11-25 MED ORDER — IPRATROPIUM BROMIDE 0.06 % NA SOLN: 1.0000 | Freq: Four times a day (QID) | NASAL | 0 refills | Status: AC | PRN

## 2016-11-25 NOTE — Progress Notes (Signed)
HPI:                                                                Vincent Ramirez is a 56 y.o. male who presents to [facility]: Primary Care Sports Medicine today to establish care  Current concerns include: URI symptoms  URI   This is a new problem. The current episode started yesterday. The problem has been unchanged. There has been no fever. Associated symptoms include congestion, coughing (productive of mucus) and rhinorrhea. Pertinent negatives include no abdominal pain, nausea, plugged ear sensation, sore throat or wheezing. Treatments tried: Mucinex. The treatment provided no relief.   Headaches: followed by neurology, Dr. Weston Settle at [facility]. Taking Indomethacin daily.  BPH/Hx of nephrolithiasis: followed by urology, Dr. Benancio Deeds.   Past Medical History:  Diagnosis Date  . BPH (benign prostatic hyperplasia)   . Former smoker   . Frequent headaches   . Nephrolithiasis   . Renal disorder    calculi  . 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 White matter abnormality on MRI of brain    Past Surgical History:  Procedure Laterality Date  . BICEPT TENODESIS    . COLONOSCOPY    . HAND SURGERY    . HERNIA REPAIR    . LITHOTRIPSY    . LUMBAR EPIDURAL INJECTION    . ROTATOR CUFF REPAIR     Social History  Substance Use Topics  . Smoking status: Former Games developer  . Smokeless tobacco: Never Used  . Alcohol use No   family history includes Alcohol abuse in his father; Breast cancer in his paternal aunt; Diabetes in his father; Hyperlipidemia in his brother, father, mother, and sister; Hypertension in his brother, father, mother, and sister; Lung cancer in his paternal uncle; Pancreatic cancer in his paternal grandfather; Stroke in his father.  ROS: negative except as noted in the HPI  Medications: Current Outpatient Prescriptions  Medication Sig Dispense Refill  . indomethacin (INDOCIN) 25 MG capsule Take 25 mg by mouth daily.    . SUMAtriptan (IMITREX)  50 MG tablet Take 50 mg by mouth every 2 (two) hours as needed. May repeat in 2 hours if headache persists or recurs.    . tamsulosin (FLOMAX) 0.4 MG CAPS capsule Take 1 capsule by mouth daily.    Marland Kitchen ipratropium (ATROVENT) 0.06 % nasal spray Place 1 spray into both nostrils 4 (four) times daily as needed. 15 mL 0  . Phenylephrine-Pheniramine-DM (THERAFLU COLD & COUGH) 11-16-18 MG PACK Follow instructions on packaging 84 each 0   No current facility-administered medications for this visit.    No Known Allergies     Objective:  BP 115/75   Pulse 71   Temp 97.9 F (36.6 C) (Oral)   Ht 6' (1.829 m)   Wt 171 lb (77.6 kg)   BMI 23.19 kg/m  Gen:  alert, not ill-appearing, no distress, appropriate for age HEENT: head normocephalic without obvious abnormality, conjunctiva and cornea clear, wearing glasses, right TM obstructed by cerumen, left TM clear, oropharynx clear, uvula midline, no sinus tenderness, neck supple, trachea midline Pulm: Normal work of breathing, normal phonation, clear to auscultation bilaterally, no wheezes, rales or rhonchi CV: Normal rate, regular rhythm, s1 and s2 distinct, no murmurs, clicks or rubs  Neuro: alert  and oriented x 3, no tremor MSK: extremities atraumatic, normal gait and station Skin: intact, no rashes on exposed skin, no jaundice, no cyanosis Psych: well-groomed, cooperative, good eye contact, euthymic mood, affect mood-congruent, speech is articulate, and thought processes clear and goal-directed  [facility] 11/25/2016  Decreased Interest 0  Down, Depressed, Hopeless 0  PHQ - 2 Score 0     No results found for this or any previous visit (from the past 72 hour(s)). No results found.    Assessment and Plan: 56 y.o. male with   1. Encounter to establish care - reviewed PMH, PSH, PFH, medications and allergies - reviewed health maintenance - immunizations UTD - due for colon cancer screening  2. Colon cancer screening -  Ambulatory referral to Gastroenterology for colonoscopy  3. Acute URI - vitals reviewed and normal. Symptomatic management. Push PO fluids. - ipratropium (ATROVENT) 0.06 % nasal spray; Place 1 spray into both nostrils 4 (four) times daily as needed.  Dispense: 15 mL; Refill: 0 - Phenylephrine-Pheniramine-DM (THERAFLU COLD & COUGH) 11-16-18 MG PACK; Follow instructions on packaging  Dispense: 84 each; Refill: 0  4. Benign prostatic hyperplasia, unspecified whether lower urinary tract symptoms present, History of nephrolithiasis - cont to follow-up with Urology  5. Trigeminal autonomic cephalgias - cont to follow-up with Neurology   Patient education and anticipatory guidance given Patient agrees with treatment plan Follow-up in 1 month for wellness exam or sooner as needed if symptoms worsen or fail to improve  Levonne Hubertharley E. Jakyah Bradby PA-C

## 2016-11-25 NOTE — Patient Instructions (Addendum)
- Tylenol 1000 mg every 8 hours as needed   Upper Respiratory Infection, Adult Most upper respiratory infections (URIs) are a viral infection of the air passages leading to the lungs. A URI affects the nose, throat, and upper air passages. The most common type of URI is nasopharyngitis and is typically referred to as "the common cold." URIs run their course and usually go away on their own. Most of the time, a URI does not require medical attention, but sometimes a bacterial infection in the upper airways can follow a viral infection. This is called a secondary infection. Sinus and middle ear infections are common types of secondary upper respiratory infections. Bacterial pneumonia can also complicate a URI. A URI can worsen asthma and chronic obstructive pulmonary disease (COPD). Sometimes, these complications can require emergency medical care and may be life threatening. What are the causes? Almost all URIs are caused by viruses. A virus is a type of germ and can spread from one person to another. What increases the risk? You may be at risk for a URI if:  You smoke.  You have chronic heart or lung disease.  You have a weakened defense (immune) system.  You are very young or very old.  You have nasal allergies or asthma.  You work in crowded or poorly ventilated areas.  You work in health care facilities or schools.  What are the signs or symptoms? Symptoms typically develop 2-3 days after you come in contact with a cold virus. Most viral URIs last 7-10 days. However, viral URIs from the influenza virus (flu virus) can last 14-18 days and are typically more severe. Symptoms may include:  Runny or stuffy (congested) nose.  Sneezing.  Cough.  Sore throat.  Headache.  Fatigue.  Fever.  Loss of appetite.  Pain in your forehead, behind your eyes, and over your cheekbones (sinus pain).  Muscle aches.  How is this diagnosed? Your health care provider may diagnose a URI  by:  Physical exam.  Tests to check that your symptoms are not due to another condition such as: ? Strep throat. ? Sinusitis. ? Pneumonia. ? Asthma.  How is this treated? A URI goes away on its own with time. It cannot be cured with medicines, but medicines may be prescribed or recommended to relieve symptoms. Medicines may help:  Reduce your fever.  Reduce your cough.  Relieve nasal congestion.  Follow these instructions at home:  Take medicines only as directed by your health care provider.  Gargle warm saltwater or take cough drops to comfort your throat as directed by your health care provider.  Use a warm mist humidifier or inhale steam from a shower to increase air moisture. This may make it easier to breathe.  Drink enough fluid to keep your urine clear or pale yellow.  Eat soups and other clear broths and maintain good nutrition.  Rest as needed.  Return to work when your temperature has returned to normal or as your health care provider advises. You may need to stay home longer to avoid infecting others. You can also use a face mask and careful hand washing to prevent spread of the virus.  Increase the usage of your inhaler if you have asthma.  Do not use any tobacco products, including cigarettes, chewing tobacco, or electronic cigarettes. If you need help quitting, ask your health care provider. How is this prevented? The best way to protect yourself from getting a cold is to practice good hygiene.  Avoid oral  or hand contact with people with cold symptoms.  Wash your hands often if contact occurs.  There is no clear evidence that vitamin C, vitamin E, echinacea, or exercise reduces the chance of developing a cold. However, it is always recommended to get plenty of rest, exercise, and practice good nutrition. Contact a health care provider if:  You are getting worse rather than better.  Your symptoms are not controlled by medicine.  You have  chills.  You have worsening shortness of breath.  You have brown or red mucus.  You have yellow or brown nasal discharge.  You have pain in your face, especially when you bend forward.  You have a fever.  You have swollen neck glands.  You have pain while swallowing.  You have white areas in the back of your throat. Get help right away if:  You have severe or persistent: ? Headache. ? Ear pain. ? Sinus pain. ? Chest pain.  You have chronic lung disease and any of the following: ? Wheezing. ? Prolonged cough. ? Coughing up blood. ? A change in your usual mucus.  You have a stiff neck.  You have changes in your: ? Vision. ? Hearing. ? Thinking. ? Mood. This information is not intended to replace advice given to you by your health care provider. Make sure you discuss any questions you have with your health care provider. Document Released: 07/09/2000 Document Revised: 09/16/2015 Document Reviewed: 04/20/2013 Elsevier Interactive Patient Education  2017 [employer]

## 2016-11-28 ENCOUNTER — Telehealth: Payer: Self-pay

## 2016-11-28 NOTE — Telephone Encounter (Signed)
Patient advised of recommendations.  

## 2016-11-28 NOTE — Telephone Encounter (Signed)
Vincent HeckRandy called and states he is feeling worse. He has headaches, pressure in face, green-yellow sputum, chills and night sweats. Denies fever, ear pain or cough. He would like an antibiotic. Please advise.

## 2016-11-28 NOTE — Telephone Encounter (Signed)
Antibiotic is not appropriate < 1 week of symptoms This is still likely a viral sinusitis Recommend using netty pot, nasal spray, oral decongestant, hydration and rest If symptoms persist for 2 weeks follow-up
